# Patient Record
Sex: Male | Born: 1965 | Race: White | Hispanic: No | Marital: Married
Health system: Southern US, Community
[De-identification: ages and names within clinical notes are randomized; demographics above are authoritative.]

## PROBLEM LIST (undated history)

## (undated) DIAGNOSIS — F4310 Post-traumatic stress disorder, unspecified: Secondary | ICD-10-CM

---

## 2018-08-20 ENCOUNTER — Other Ambulatory Visit: Payer: Self-pay

## 2018-08-20 ENCOUNTER — Emergency Department (HOSPITAL_COMMUNITY): Payer: Federal, State, Local not specified - PPO

## 2018-08-20 ENCOUNTER — Encounter (HOSPITAL_COMMUNITY): Payer: Self-pay

## 2018-08-20 ENCOUNTER — Emergency Department (HOSPITAL_COMMUNITY)
Admission: EM | Admit: 2018-08-20 | Discharge: 2018-08-20 | Disposition: A | Discharge status: Home / Self Care | Payer: Federal, State, Local not specified - PPO | Attending: Emergency Medicine | Admitting: Emergency Medicine

## 2018-08-20 DIAGNOSIS — Y92410 Unspecified street and highway as the place of occurrence of the external cause: Secondary | ICD-10-CM | POA: Diagnosis not present

## 2018-08-20 DIAGNOSIS — Z79899 Other long term (current) drug therapy: Secondary | ICD-10-CM | POA: Insufficient documentation

## 2018-08-20 DIAGNOSIS — R079 Chest pain, unspecified: Secondary | ICD-10-CM | POA: Diagnosis not present

## 2018-08-20 DIAGNOSIS — Y999 Unspecified external cause status: Secondary | ICD-10-CM | POA: Insufficient documentation

## 2018-08-20 DIAGNOSIS — R109 Unspecified abdominal pain: Secondary | ICD-10-CM | POA: Insufficient documentation

## 2018-08-20 DIAGNOSIS — Y9389 Activity, other specified: Secondary | ICD-10-CM | POA: Diagnosis not present

## 2018-08-20 DIAGNOSIS — S42111A Displaced fracture of body of scapula, right shoulder, initial encounter for closed fracture: Secondary | ICD-10-CM | POA: Diagnosis not present

## 2018-08-20 DIAGNOSIS — S0990XA Unspecified injury of head, initial encounter: Secondary | ICD-10-CM | POA: Diagnosis present

## 2018-08-20 DIAGNOSIS — T1490XA Injury, unspecified, initial encounter: Secondary | ICD-10-CM

## 2018-08-20 HISTORY — DX: Post-traumatic stress disorder, unspecified: F43.10

## 2018-08-20 LAB — CBC
HCT: 49.6 % (ref 39.0–52.0)
Hemoglobin: 15.9 g/dL (ref 13.0–17.0)
MCH: 28.4 pg (ref 26.0–34.0)
MCHC: 32.1 g/dL (ref 30.0–36.0)
MCV: 88.7 fL (ref 80.0–100.0)
NRBC: 0 % (ref 0.0–0.2)
PLATELETS: 227 10*3/uL (ref 150–400)
RBC: 5.59 MIL/uL (ref 4.22–5.81)
RDW: 13.2 % (ref 11.5–15.5)
WBC: 8.7 10*3/uL (ref 4.0–10.5)

## 2018-08-20 LAB — I-STAT CHEM 8, ED
BUN: 12 mg/dL (ref 6–20)
CHLORIDE: 104 mmol/L (ref 98–111)
CREATININE: 1.3 mg/dL — AB (ref 0.61–1.24)
Calcium, Ion: 1.1 mmol/L — ABNORMAL LOW (ref 1.15–1.40)
GLUCOSE: 98 mg/dL (ref 70–99)
HCT: 47 % (ref 39.0–52.0)
Hemoglobin: 16 g/dL (ref 13.0–17.0)
Potassium: 3.4 mmol/L — ABNORMAL LOW (ref 3.5–5.1)
Sodium: 142 mmol/L (ref 135–145)
TCO2: 29 mmol/L (ref 22–32)

## 2018-08-20 LAB — COMPREHENSIVE METABOLIC PANEL
ALK PHOS: 46 U/L (ref 38–126)
ALT: 64 U/L — ABNORMAL HIGH (ref 0–44)
AST: 40 U/L (ref 15–41)
Albumin: 3.9 g/dL (ref 3.5–5.0)
Anion gap: 8 (ref 5–15)
BUN: 10 mg/dL (ref 6–20)
CALCIUM: 8.7 mg/dL — AB (ref 8.9–10.3)
CO2: 24 mmol/L (ref 22–32)
Chloride: 108 mmol/L (ref 98–111)
Creatinine, Ser: 1.14 mg/dL (ref 0.61–1.24)
GFR calc Af Amer: >60 mL/min (ref >60)
Glucose, Bld: 104 mg/dL — ABNORMAL HIGH (ref 70–99)
Potassium: 3.4 mmol/L — ABNORMAL LOW (ref 3.5–5.1)
SODIUM: 140 mmol/L (ref 135–145)
Total Bilirubin: 0.9 mg/dL (ref 0.3–1.2)
Total Protein: 7 g/dL (ref 6.5–8.1)

## 2018-08-20 LAB — ETHANOL: Alcohol, Ethyl (B): <10 mg/dL (ref <10)

## 2018-08-20 LAB — PROTIME-INR
INR: 1.01
PROTHROMBIN TIME: 13.2 s (ref 11.4–15.2)

## 2018-08-20 LAB — SAMPLE TO BLOOD BANK

## 2018-08-20 LAB — I-STAT CG4 LACTIC ACID, ED: Lactic Acid, Venous: 2.17 mmol/L (ref 0.5–1.9)

## 2018-08-20 LAB — CDS SEROLOGY

## 2018-08-20 MED ORDER — ONDANSETRON HCL 4 MG/2ML IJ SOLN
4.0000 mg | Freq: Once | INTRAMUSCULAR | Status: AC
  Administered 2018-08-20: 4 mg via INTRAVENOUS
  Filled 2018-08-20: qty 2

## 2018-08-20 MED ORDER — HYDROMORPHONE HCL 1 MG/ML IJ SOLN
1.0000 mg | Freq: Once | INTRAMUSCULAR | Status: AC
  Administered 2018-08-20: 1 mg via INTRAVENOUS
  Filled 2018-08-20: qty 1

## 2018-08-20 MED ORDER — HYDROCODONE-ACETAMINOPHEN 5-325 MG PO TABS: 1.0000 | ORAL_TABLET | ORAL | 0 refills | Status: DC | PRN

## 2018-08-20 MED ORDER — FENTANYL CITRATE (PF) 100 MCG/2ML IJ SOLN
INTRAMUSCULAR | Status: AC | PRN
  Administered 2018-08-20: 100 ug via INTRAVENOUS

## 2018-08-20 MED ORDER — IOHEXOL 300 MG/ML  SOLN
100.0000 mL | Freq: Once | INTRAMUSCULAR | Status: AC | PRN
  Administered 2018-08-20: 100 mL via INTRAVENOUS

## 2018-08-20 MED ORDER — FENTANYL CITRATE (PF) 100 MCG/2ML IJ SOLN
INTRAMUSCULAR | Status: AC
  Filled 2018-08-20: qty 2

## 2018-08-20 MED ORDER — HYDROCODONE-ACETAMINOPHEN 5-325 MG PO TABS: 1.0000 | ORAL_TABLET | ORAL | 0 refills | Status: AC | PRN

## 2018-08-20 MED ORDER — FENTANYL CITRATE (PF) 100 MCG/2ML IJ SOLN
100.0000 ug | Freq: Once | INTRAMUSCULAR | Status: AC
  Administered 2018-08-20: 100 ug via INTRAVENOUS
  Filled 2018-08-20: qty 2
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
## 2018-08-20 NOTE — ED Notes (Signed)
The pt wants his c-collar off
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
## 2018-08-20 NOTE — ED Notes (Signed)
Pt wants c-collar off    Repositioned  On the stretcher

## 2018-08-20 NOTE — ED Notes (Signed)
Patient transported to X-ray 
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
